# Patient Record
Sex: Male | Born: 1935 | Race: White | Hispanic: No | Marital: Married
Health system: Southern US, Community
[De-identification: ages and names within clinical notes are randomized; demographics above are authoritative.]

---

## 2005-04-05 ENCOUNTER — Ambulatory Visit: Payer: Self-pay | Admitting: Internal Medicine

## 2005-08-29 ENCOUNTER — Inpatient Hospital Stay: Payer: Self-pay | Admitting: General Practice

## 2006-03-28 ENCOUNTER — Inpatient Hospital Stay: Payer: Self-pay | Admitting: General Practice

## 2006-04-05 ENCOUNTER — Ambulatory Visit: Payer: Self-pay | Admitting: General Practice

## 2007-01-11 ENCOUNTER — Observation Stay: Payer: Self-pay | Admitting: Unknown Physician Specialty

## 2007-01-11 ENCOUNTER — Other Ambulatory Visit: Payer: Self-pay

## 2007-01-12 ENCOUNTER — Other Ambulatory Visit: Payer: Self-pay

## 2008-05-08 ENCOUNTER — Ambulatory Visit: Payer: Self-pay | Admitting: Ophthalmology

## 2008-05-20 ENCOUNTER — Ambulatory Visit: Payer: Self-pay | Admitting: Ophthalmology

## 2010-02-28 ENCOUNTER — Inpatient Hospital Stay: Payer: Self-pay | Admitting: Internal Medicine

## 2010-05-20 ENCOUNTER — Ambulatory Visit: Payer: Self-pay | Admitting: Internal Medicine

## 2010-10-01 ENCOUNTER — Emergency Department: Payer: Self-pay | Admitting: Emergency Medicine

## 2010-10-07 ENCOUNTER — Ambulatory Visit: Payer: Self-pay | Admitting: Urology

## 2010-10-09 ENCOUNTER — Inpatient Hospital Stay: Payer: Self-pay | Admitting: Internal Medicine

## 2010-10-15 ENCOUNTER — Emergency Department: Payer: Self-pay | Admitting: Emergency Medicine

## 2012-02-29 ENCOUNTER — Ambulatory Visit: Payer: Self-pay | Admitting: Internal Medicine

## 2012-05-31 LAB — CBC
HGB: 11.8 g/dL — ABNORMAL LOW (ref 13.0–18.0)
MCH: 30.6 pg (ref 26.0–34.0)
MCV: 91 fL (ref 80–100)
Platelet: 147 10*3/uL — ABNORMAL LOW (ref 150–440)
RDW: 14.9 % — ABNORMAL HIGH (ref 11.5–14.5)
WBC: 5.6 10*3/uL (ref 3.8–10.6)

## 2012-05-31 LAB — DRUG SCREEN, URINE
Barbiturates, Ur Screen: NEGATIVE (ref ?–200)
Benzodiazepine, Ur Scrn: NEGATIVE (ref ?–200)
Cannabinoid 50 Ng, Ur ~~LOC~~: NEGATIVE (ref ?–50)
MDMA (Ecstasy)Ur Screen: NEGATIVE (ref ?–500)
Methadone, Ur Screen: NEGATIVE (ref ?–300)
Opiate, Ur Screen: NEGATIVE (ref ?–300)
Phencyclidine (PCP) Ur S: NEGATIVE (ref ?–25)
Tricyclic, Ur Screen: NEGATIVE (ref ?–1000)

## 2012-05-31 LAB — COMPREHENSIVE METABOLIC PANEL
Alkaline Phosphatase: 175 U/L — ABNORMAL HIGH (ref 50–136)
Anion Gap: 6 — ABNORMAL LOW (ref 7–16)
Bilirubin,Total: 0.5 mg/dL (ref 0.2–1.0)
Calcium, Total: 8.8 mg/dL (ref 8.5–10.1)
Creatinine: 1.36 mg/dL — ABNORMAL HIGH (ref 0.60–1.30)
EGFR (African American): 53 — ABNORMAL LOW
Potassium: 4.7 mmol/L (ref 3.5–5.1)
SGOT(AST): 32 U/L (ref 15–37)
Sodium: 139 mmol/L (ref 136–145)
Total Protein: 7.2 g/dL (ref 6.4–8.2)

## 2012-05-31 LAB — URINALYSIS, COMPLETE
Ketone: NEGATIVE
RBC,UR: 16 /HPF (ref 0–5)
Specific Gravity: 1.017 (ref 1.003–1.030)
WBC UR: 12 /HPF (ref 0–5)

## 2012-05-31 LAB — PROTIME-INR: Prothrombin Time: 14.1 secs (ref 11.5–14.7)

## 2012-05-31 LAB — APTT: Activated PTT: 33.9 secs (ref 23.6–35.9)

## 2012-05-31 LAB — ETHANOL
Ethanol %: <0.003 % (ref 0.000–0.080)
Ethanol: <3 mg/dL

## 2012-06-01 ENCOUNTER — Inpatient Hospital Stay: Payer: Self-pay | Admitting: Internal Medicine

## 2012-06-01 LAB — CK TOTAL AND CKMB (NOT AT ARMC)
CK, Total: 66 U/L (ref 35–232)
CK-MB: 1.4 ng/mL (ref 0.5–3.6)
CK-MB: 1.7 ng/mL (ref 0.5–3.6)

## 2012-06-01 LAB — TROPONIN I: Troponin-I: 0.18 ng/mL — ABNORMAL HIGH

## 2012-06-02 LAB — COMPREHENSIVE METABOLIC PANEL
Albumin: 3.5 g/dL (ref 3.4–5.0)
Alkaline Phosphatase: 156 U/L — ABNORMAL HIGH (ref 50–136)
Anion Gap: 8 (ref 7–16)
Calcium, Total: 8.8 mg/dL (ref 8.5–10.1)
Chloride: 106 mmol/L (ref 98–107)
Creatinine: 1.12 mg/dL (ref 0.60–1.30)
Osmolality: 282 (ref 275–301)
SGOT(AST): 29 U/L (ref 15–37)
SGPT (ALT): 27 U/L (ref 12–78)

## 2012-06-02 LAB — CBC WITH DIFFERENTIAL/PLATELET
Basophil #: 0 10*3/uL (ref 0.0–0.1)
Eosinophil #: 0.1 10*3/uL (ref 0.0–0.7)
HGB: 11.6 g/dL — ABNORMAL LOW (ref 13.0–18.0)
MCH: 29.7 pg (ref 26.0–34.0)
MCV: 92 fL (ref 80–100)
Monocyte #: 0.6 x10 3/mm (ref 0.2–1.0)
Neutrophil %: 67.6 %
RBC: 3.92 10*6/uL — ABNORMAL LOW (ref 4.40–5.90)
RDW: 14.4 % (ref 11.5–14.5)

## 2012-06-04 LAB — BASIC METABOLIC PANEL
Anion Gap: 6 — ABNORMAL LOW (ref 7–16)
Calcium, Total: 8.8 mg/dL (ref 8.5–10.1)
Chloride: 105 mmol/L (ref 98–107)
Co2: 26 mmol/L (ref 21–32)
Creatinine: 1.1 mg/dL (ref 0.60–1.30)
Glucose: 97 mg/dL (ref 65–99)
Potassium: 4.1 mmol/L (ref 3.5–5.1)
Sodium: 137 mmol/L (ref 136–145)

## 2012-11-20 DEATH — deceased

## 2013-09-21 IMAGING — CT CT HEAD WITHOUT CONTRAST
1 series · 15 of 30 positions shown, 19 images · non-contrast
Comparison: none

REASON FOR EXAM: ams
COMMENTS:

[Series 2: soft tissue · axial · 0.43mm/px · z∈[+566,+701]mm · 15 of 31 slices shown, 19 images]
[im 2/31  brain]
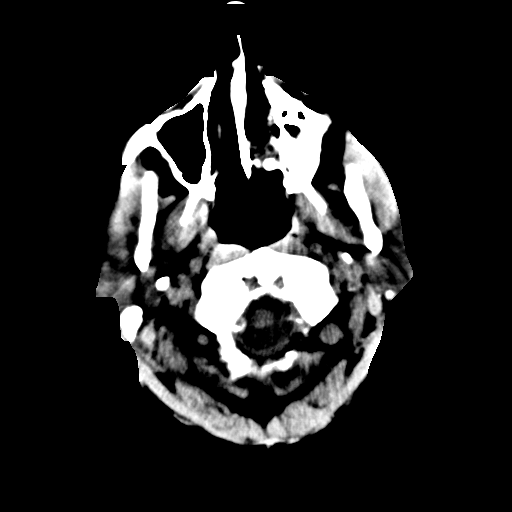
[im 2/31  bone]
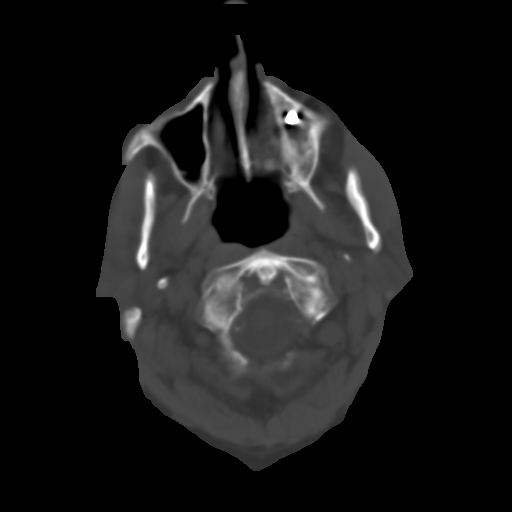
[im 4/31  brain]
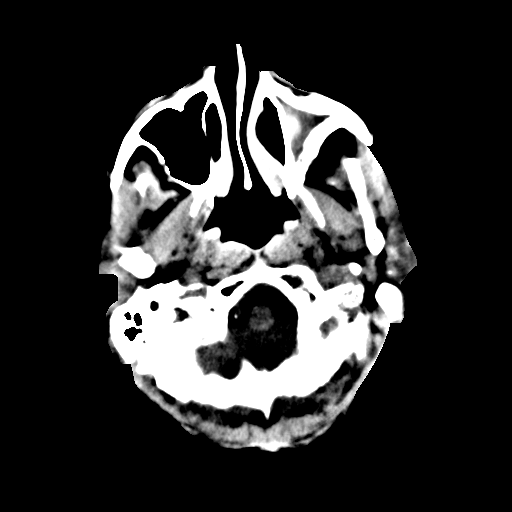
[im 6/31  brain]
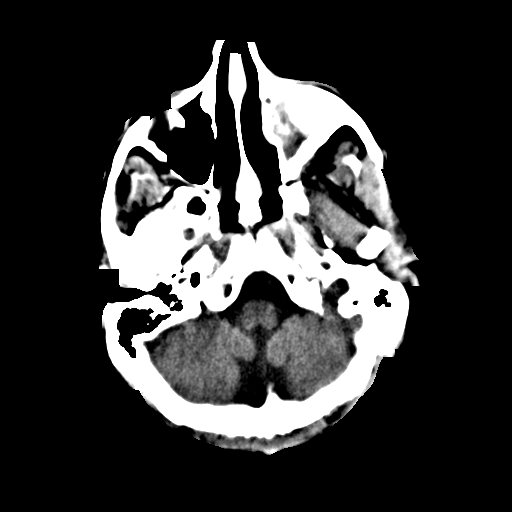
[im 8/31  brain]
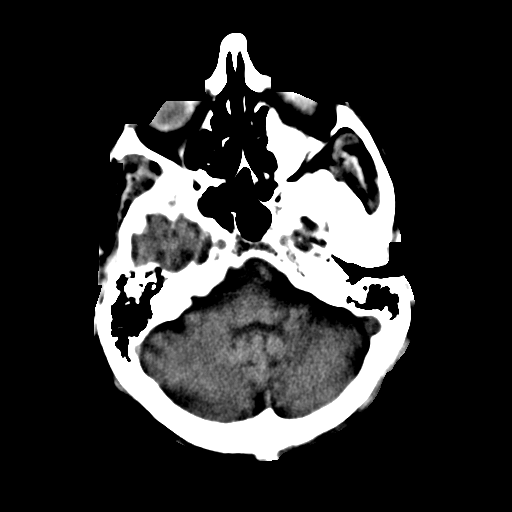
[im 10/31  brain]
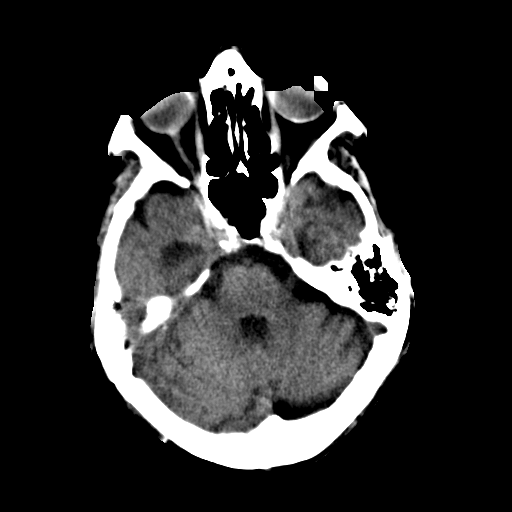
[im 10/31  bone]
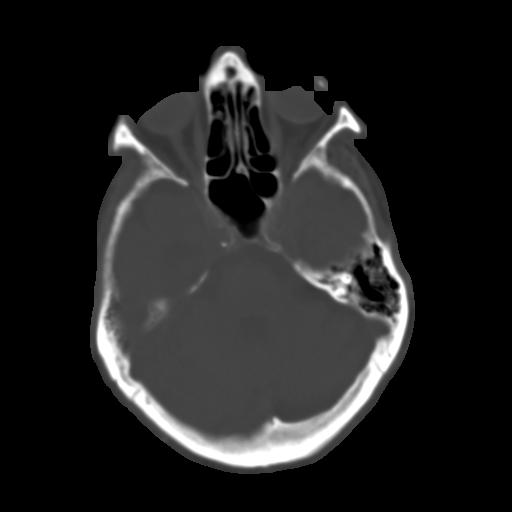
[im 12/31  brain]
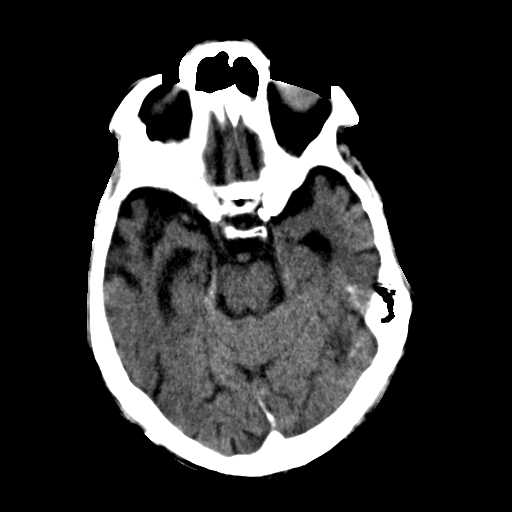
[im 14/31  brain]
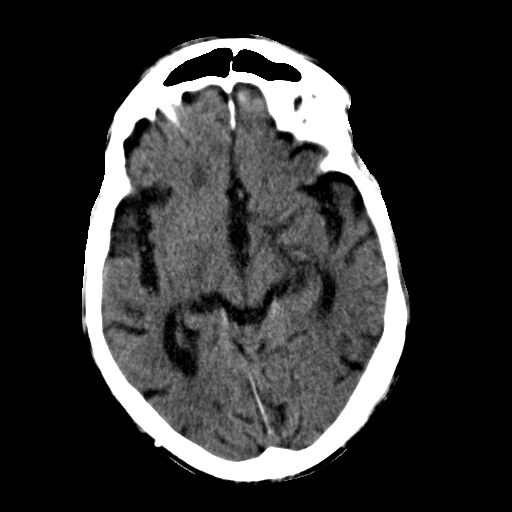
[im 16/31  brain]
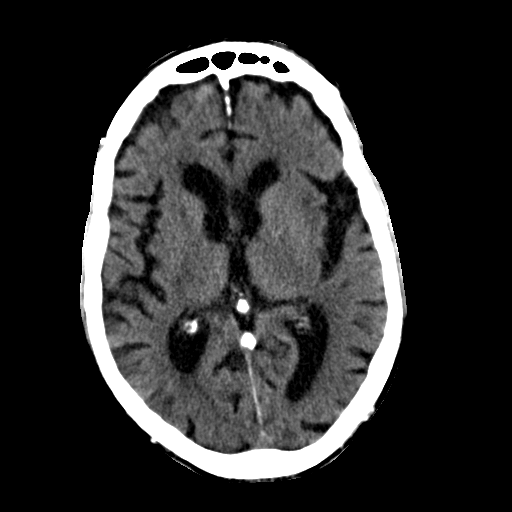
[im 17/31  brain]
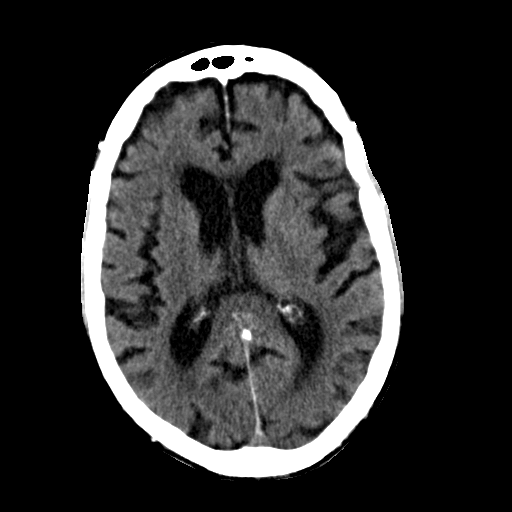
[im 17/31  bone]
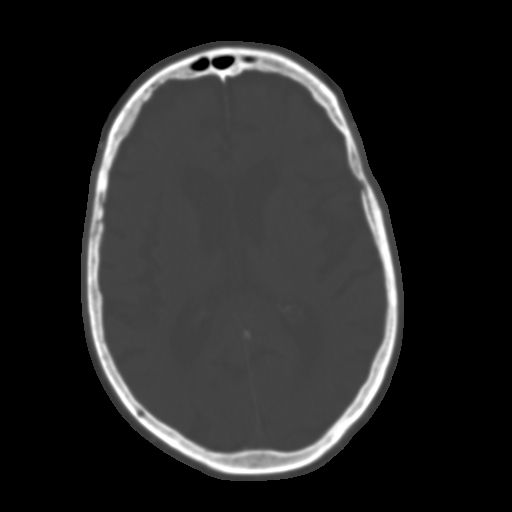
[im 19/31  brain]
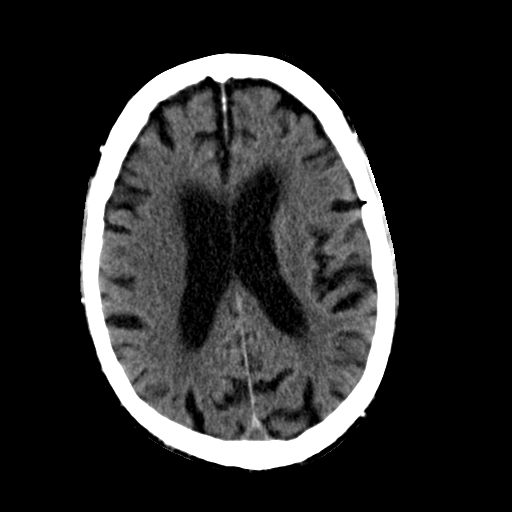
[im 21/31  brain]
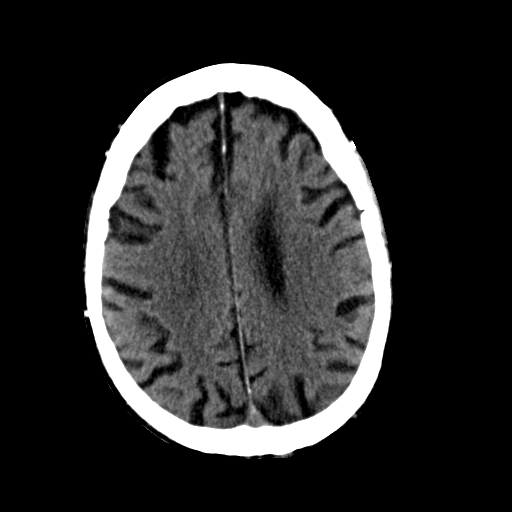
[im 23/31  brain]
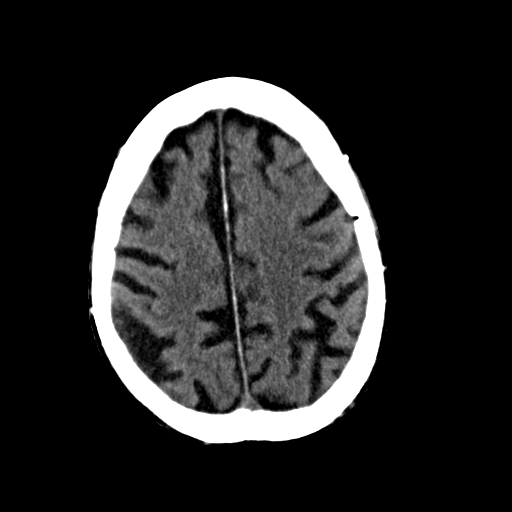
[im 25/31  brain]
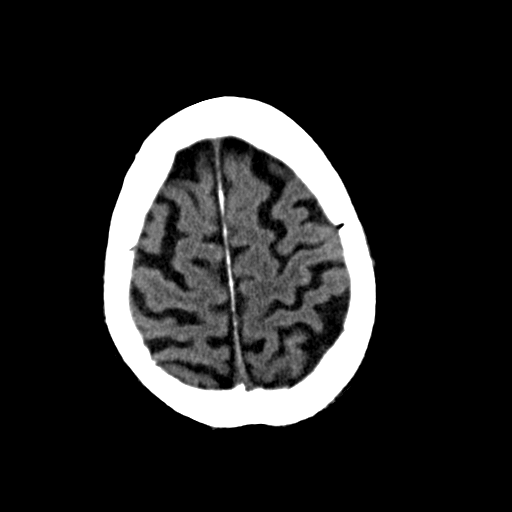
[im 25/31  bone]
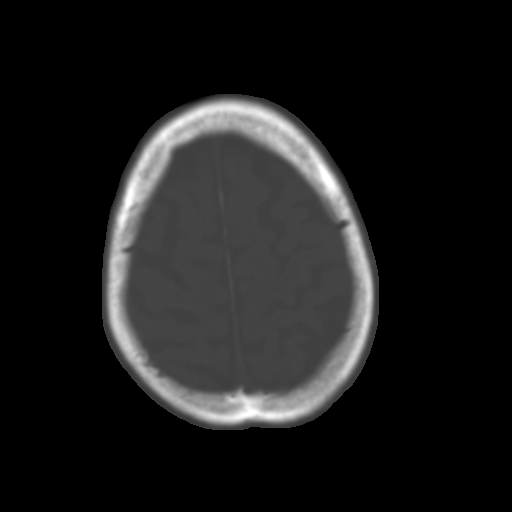
[im 27/31  brain]
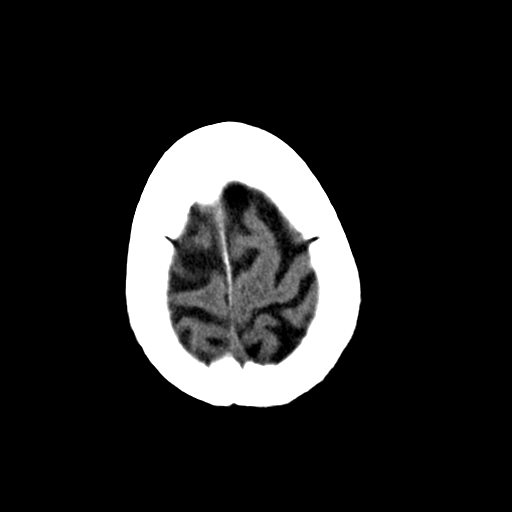
[im 29/31  brain]
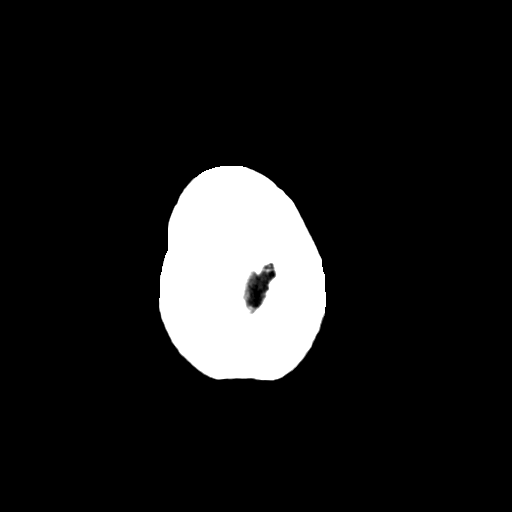

[15 of 30 positions shown; findings below may reference images not displayed]

PROCEDURE:     CT  - CT HEAD WITHOUT CONTRAST  - May 31, 2012 [DATE]

RESULT:     Axial noncontrast CT scanning was performed through the brain
with reconstructions at 5 mm intervals and slice thicknesses. Comparison is
made to the study dated October 08, 2010.

There is moderate diffuse cerebral and cerebellar atrophy with compensatory
ventriculomegaly. There is no intracranial hemorrhage nor intracranial mass
effect. Old subcentimeter lacunar infarctions in the superior aspect of the
basal ganglia on the left are present. The cerebellum and brainstem are
normal in density. At bone window settings there is chronic opacification of
the left maxillary sinus. A focal metallic density is present within it.
There is no evidence of an acute skull fracture.
IMPRESSION: 1. There is no evidence of acute ischemic or hemorrhagic infarction. There
are findings consistent with chronic small vessel ischemic change which are
stable.
2. There is no intracranial mass effect.
3. There is mild to moderate diffuse cerebral atrophy with compensatory
ventriculomegaly.

A preliminary report was sent to the [HOSPITAL] the conclusion
of the study.

[REDACTED]

## 2013-09-22 IMAGING — CR DG CHEST 2V
1 series · 2 of 2 positions shown · non-contrast
Comparison: none

REASON FOR EXAM: ams
COMMENTS:

PROCEDURE:     DXR - DXR CHEST PA (OR AP) AND LATERAL  - June 01, 2012 [DATE]
RESULT:     The cardiac silhouette is mildly enlarged. There is a left-sided
pacemaker device present. Sternotomy wires are present. The bones are
osteopenic. The lungs appear to be clear.

[Series 1: w chest lat · 0.14mm/px · 2 of 2 slices shown]
[im 1/2]
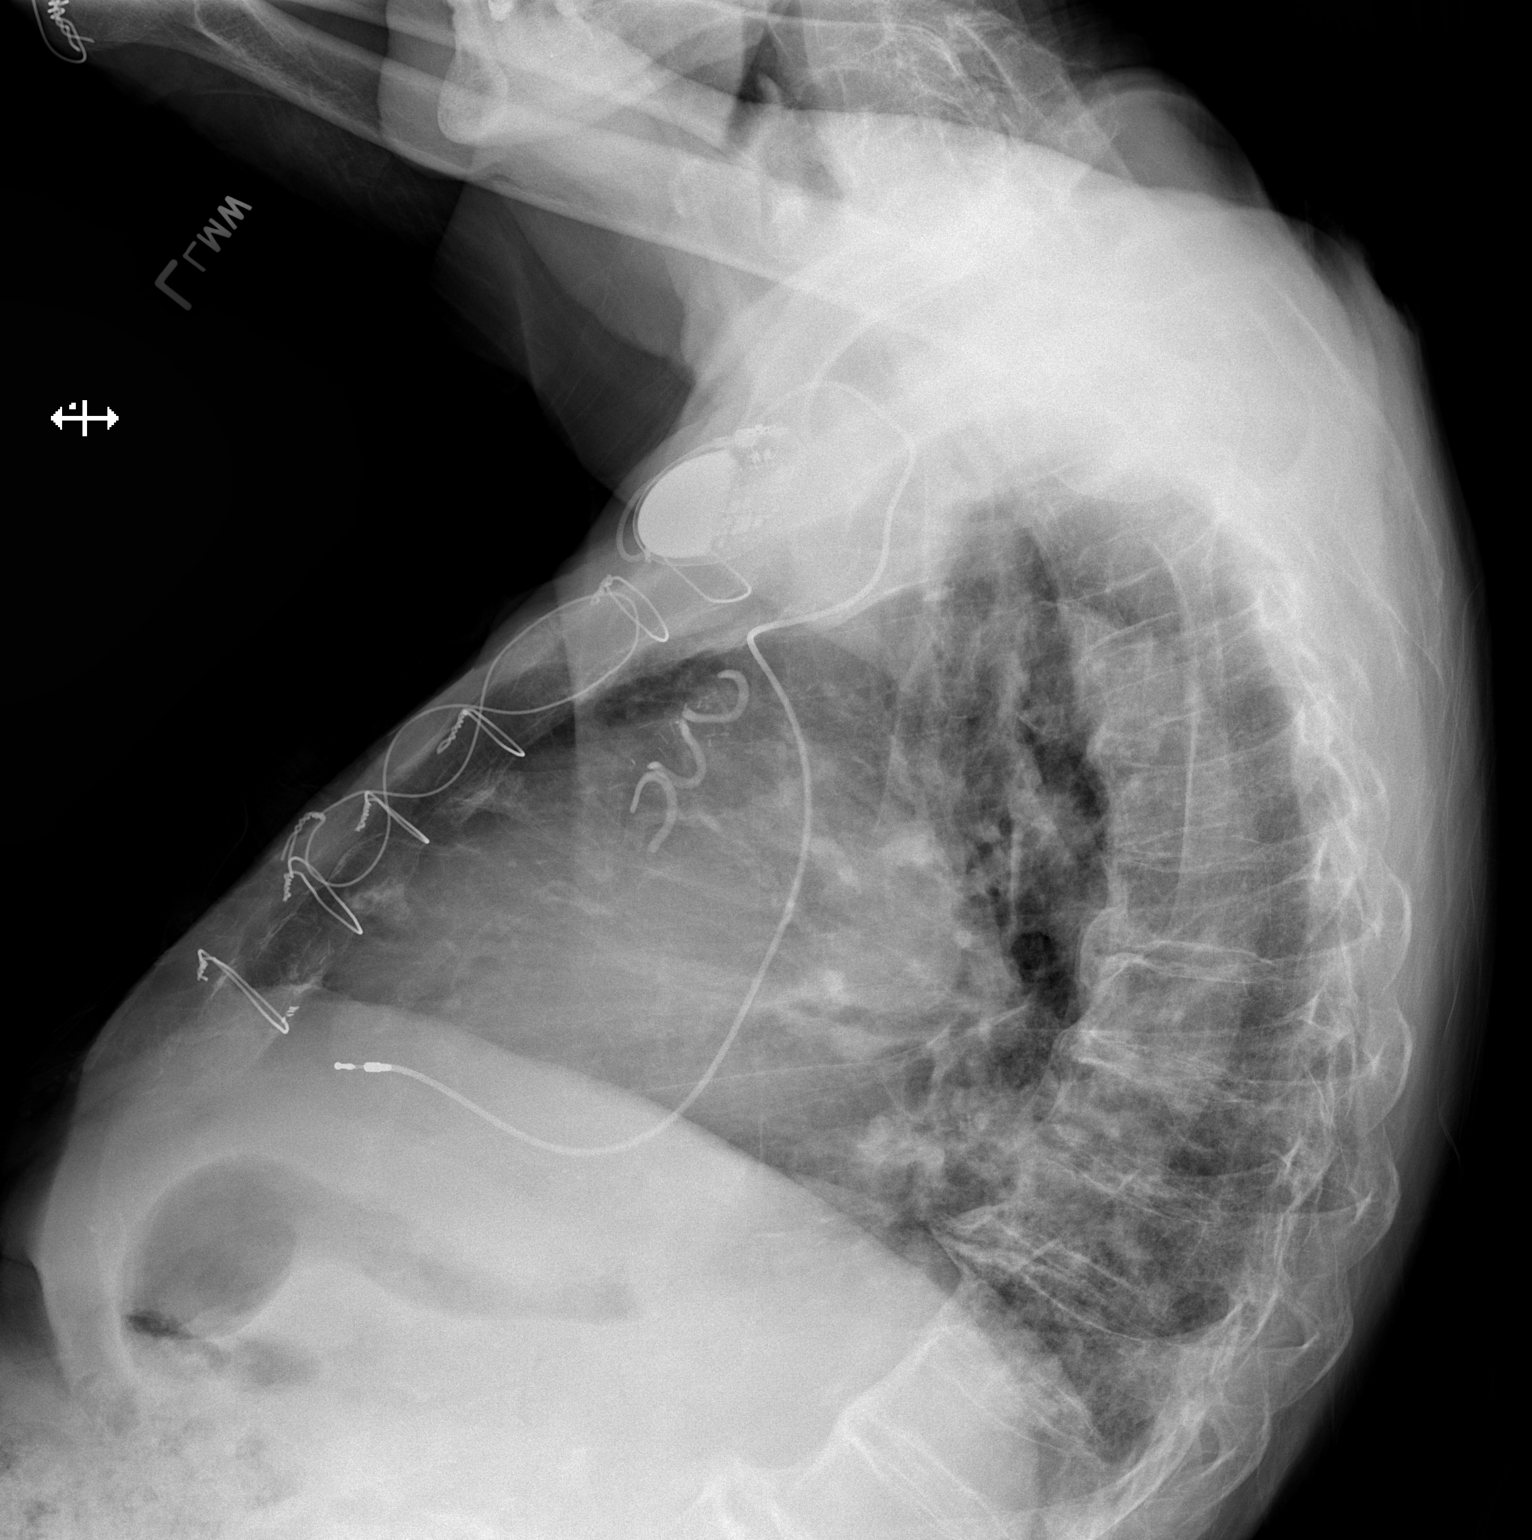
[im 2/2]
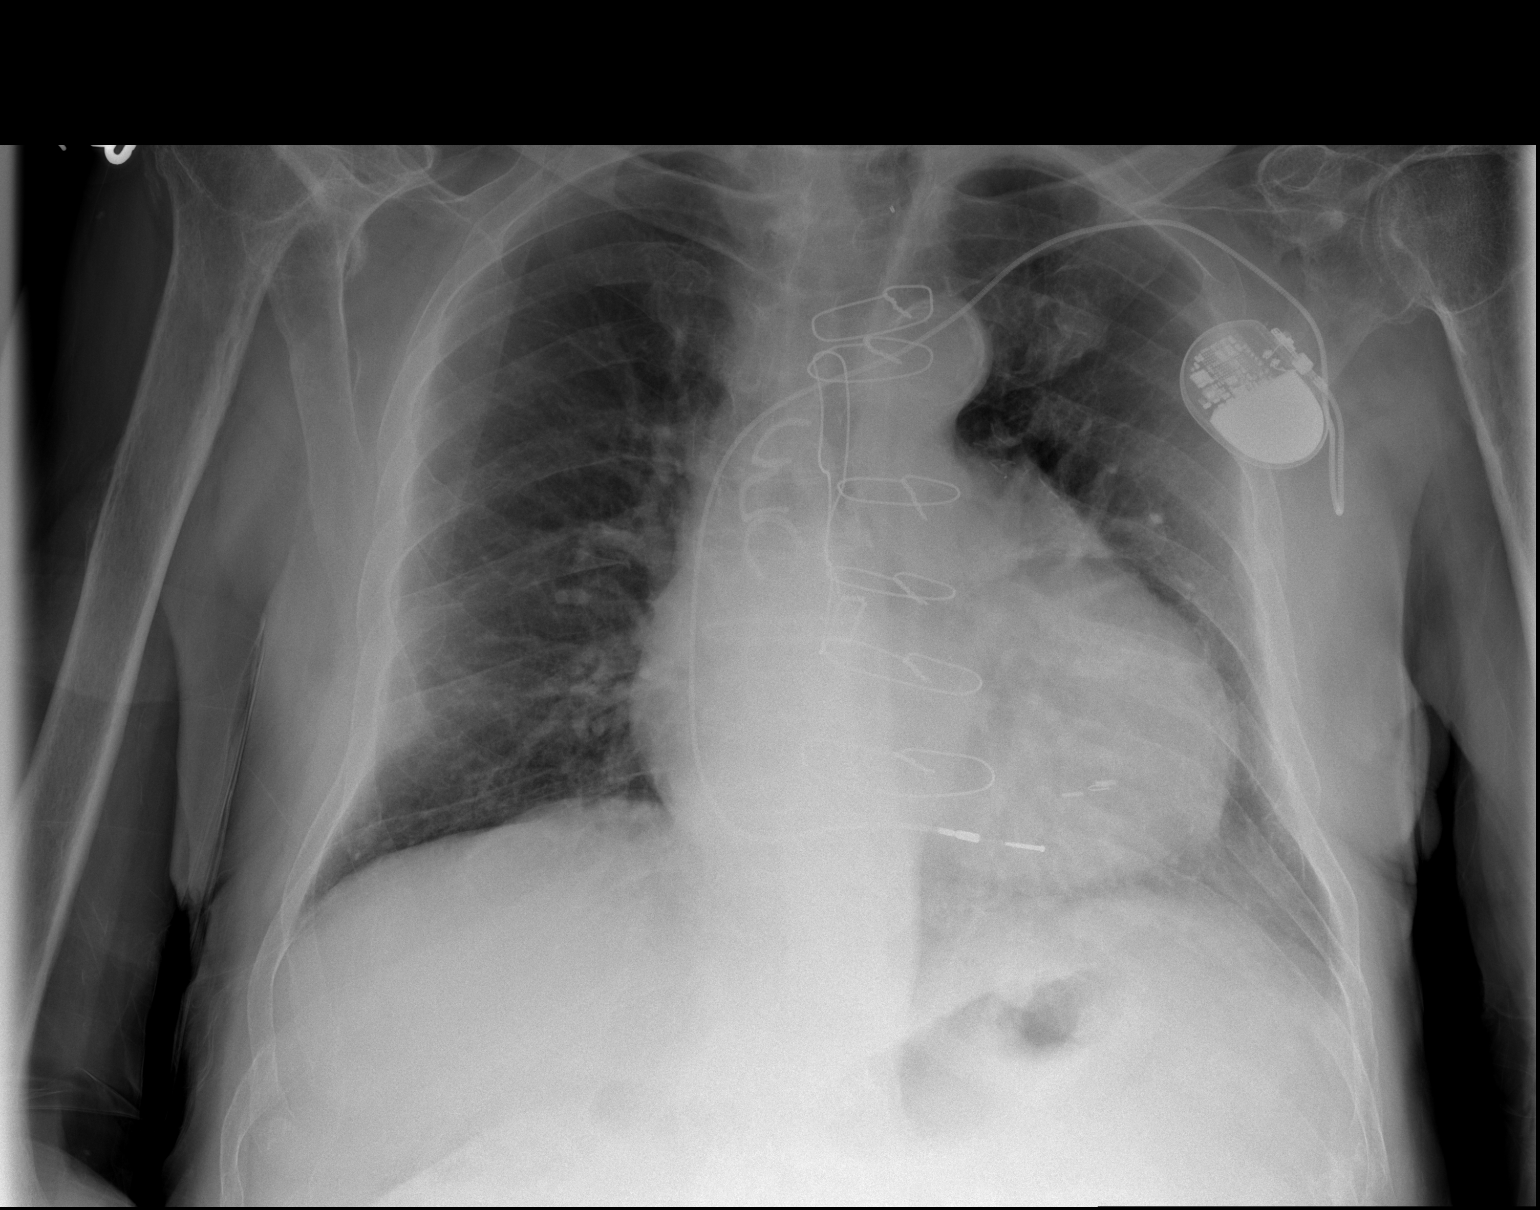

[2 of 2 positions shown; findings below may reference images not displayed]

IMPRESSION: COPD. Cardiomegaly. Osteoporosis. If there is concern for
rib fractures dedicated rib films would be recommended.

[REDACTED]

## 2014-09-12 NOTE — H&P (Signed)
PATIENT NAME:  Jared Dennis, Jared Dennis MR#:  [id number] DATE OF BIRTH:  November 19, 1920  DATE OF ADMISSION:  06/01/2012  REFERRING PHYSICIAN:  Dr. Ulice Brilliant.   PRIMARY CARE PHYSICIAN AND CARDIOLOGIST:  Dr. Cletis Athens.   CHIEF COMPLAINT:  Confusion and altered mental status.   HISTORY OF PRESENT ILLNESS:  This is an 81 year old male with significant past medical history of dementia, coronary artery disease status post bypass grafting, status post bladder cancer with bladder resection with urostomy bag, history of atrial fibrillation with pauses, status post permanent pacemaker, not on anticoagulation due to high risk of falls, presents with altered mental status.  The patient has advanced dementia, both sons at bedside and they give the history.  They report their father has advanced dementia, lives at home with their mother who has been mainly taking care of him at home, they report over the last week they have noticed acute changes in his mental status where he has started to become more confused, agitated, mildly combative, which prompted them to bring him to the Emergency Department.  In the ED, patient was afebrile, had no leukocytosis, had mild elevation of his creatinine, but patient was found to have positive urinalysis, as well as mild redness and erythema in the left lower extremity. As well, patient was found to have elevated troponin at 0.18. He had T-wave changes in the lateral leads which were old when compared to the last EKG.  As well, patient denies any shortness of breath and denies chest pain. The patient was given 324 of aspirin and 60 mg of Lovenox in the ED for his elevated troponins.  As well, was started on Rocephin for UTI and mild left lower extremity cellulitis. Hospitalist service was requested to admit the patient for further management and work-up for his symptoms and complaints. Sons at bedside and they request case management consult for possible need for placement at SNF upon  discharge, as they think at this point it is a lot for their mother to handle at home where she cannot take care of him.  As well, the patient was found for a blood pressure to be in the lower side in the ED with systolic in the 80H, diastolic in the high 21Y, where it improved with a fluid bolus.  PAST MEDICAL HISTORY: 1.  Coronary artery disease, status post bypass grafting.  2.  Bladder cancer with bladder resection and urostomy bag.  3.  History of atrial fibrillation with pauses, status post permanent pacemaker, not on warfarin due to risk for fall.  4.  Dementia.  5.  Hyperlipidemia. 6.  Hypertension.   HOME MEDICATIONS:  Simvastatin 40 mg oral at bedtime, lisinopril 20 mg oral daily.   DRUG ALLERGIES:  No known drug allergies.  SOCIAL HISTORY:  The patient is married, has two sons, retired from [employer].  No drinking or illicit drug use.  Used to smoke pipe in the past, currently nothing.   FAMILY HISTORY:  Mother died of unknown cancer at her 33s.  Father died of age 48, as well, for unknown reason.   REVIEW OF SYSTEMS:  The patient is a poor historian secondary to his dementia, but can give basic answer to some of review of system questions.   CONSTITUTIONAL:  Denies any fever or chills.  Basically reports him feeling well.  "There is nothing wrong with me."  EYES:  Denies blurry vision, double vision, pain, inflammation.  EARS, NOSE, THROAT:  Denies tinnitus, hearing loss, epistaxis, discharge or  snoring.  RESPIRATORY:  Denies cough, wheezing, hemoptysis, dyspnea.  CARDIOVASCULAR:  Denies chest pain, edema, arrhythmia, palpitation.  GASTROINTESTINAL:  Denies nausea, vomiting, diarrhea, abdominal pain, hematemesis, melena.  GENITOURINARY:  Denies dysuria, hematuria, renal colic.   ENDOCRINE:  Denies polyuria, polydipsia, heat or cold intolerance.  HEMATOLOGY:  Denies easy bruising, anemia, bleeding diathesis.  INTEGUMENTARY:  Denies any acne, rash or skin lesions.   MUSCULOSKELETAL:  Denies any gout, arthritis, cramps, knee pain, shoulder pain.  NEUROLOGIC:  Denies any weakness, epilepsy, tremors, vertigo, ataxia, as well denies dementia, but he is known to have history of dementia.  PSYCHIATRIC:  Denies anxiety, insomnia, schizophrenia, nervousness, substance or alcohol abuse.   PHYSICAL EXAMINATION: VITAL SIGNS:  Temperature 98.3, pulse 71, respiratory rate 18, blood pressure 105/67, saturating 99% on room air.  GENERAL:  Elderly male, is comfortable, in no apparent distress.  HEENT:  Head atraumatic, normocephalic.  Pupils equal, reactive to light.  Pink conjunctivae.  Anicteric sclerae.  Moist oral mucosa.  NECK:  Supple.  No thyromegaly.  No JVD.  CHEST:  Good air entry bilaterally.  No wheezing, rales, rhonchi.  CARDIOVASCULAR:  S1, S2 heard.  No rubs, murmur, gallops.  Irregularly irregular rhythm.  ABDOMEN:  Soft, nontender, nondistended, but all bowel sounds present.  Has urostomy bag in the right lower quadrant.  EXTREMITIES:  Has left lower extremity chronic discoloration.  Sons report this is kind of chronic, status post fall with multiple left lower extremity surgery.  There is some erythema at the left lower extremity.  Good pulses.  No clubbing.  No cyanosis.  PSYCHIATRIC:  The patient is pleasant, awake, alert x 1 to 2, communicative, confused.   NEUROLOGICAL:  Cranial nerves grossly intact.  Motor 5/5 in all extremities.  SKIN:  Normal skin turgor.  Warm and dry.  Some warmth in the left lower extremity.   PERTINENT LABORATORY DATA:  Glucose 98, BUN 29, creatinine 1.36, sodium 139, potassium 4.7, chloride 107, CO2 26, ALT 30, AST 32, alk phos 175.  Troponin 0.18.  White blood cell 5.6, hemoglobin 11.8, hematocrit 35.3, platelets 147.  EKG showing atrial fibrillation with T-wave inversion in the lateral leads, which is old when compared to last EKG.  INR 1.1.  Urinalysis showing positive nitrites with trace leukocyte esterase with white blood  cells of 12 and +1 bacteria.   ASSESSMENT AND PLAN:  This is an 81-year-old male with advanced dementia who presents with worsening altered mental status and confusion, has urinary tract infection and mild left lower extremity cellulitis, as well has elevated troponin.  1.  Altered mental status: The patient has baseline dementia which worsened recently, most likely due to urinary tract infection versus cellulitis, so patient will be started on Rocephin.  2.  Positive troponins: The patient denies any chest pain or shortness of breath. Has no EKG changes. Given 324 mg of aspirin in the ED and Lovenox. We will consult patient's cardiologist, Dr. Masoud in a.m. Agreed to continue him on Lovenox. We will continue to cycle patient's cardiac enzymes and follow the trend. Will obtain a 2-D echocardiogram.  We will hold on beta-blocker and lisinopril secondary to his borderline low blood pressure.  3.  Hypertension: Actually blood pressure is on the lower side, so we will hold lisinopril and we will continue with fluids.  4.  Atrial fibrillation: Currently rate-controlled.  As well, patient is high risk for falls, so he is not on warfarin. The patient is status post permanent pacemaker for pauses.    5.  Hyperlipidemia:  Continue with statin.  6.  Deep vein thrombosis prophylaxis:  Give him treatment dose Lovenox depending on decision by cardiology.  We will decide on further chemical anticoagulation.  7.  Gastrointestinal prophylaxis: On PPI.  8.  Ethics:  Discussed code status with both sons who at this point wishes him to remain FULL CODE. They report he has a LIVING WILL at home; they will bring it in a.m. to review it.   9.  As well, they request possible placement at SNF as they are saying at this point, it is too much for their mother to take care of at home, so we will order a case management consult.   TOTAL TIME SPENT ON ADMISSION AND PATIENT CARE:  Fifty-five minutes.      ____________________________ Albertine Patricia, MD dse:ea D: 06/01/2012 01:34:00 ET T: 06/01/2012 02:33:59 ET JOB#: [id number]  cc: Albertine Patricia, MD, <Dictator> Traci Gafford Graciela Husbands MD ELECTRONICALLY SIGNED 06/02/2012 6:04

## 2014-09-12 NOTE — Discharge Summary (Signed)
PATIENT NAME:  Jared StampsHOMPSON, Misha D MR#:  [id number] DATE OF BIRTH:  [birth year]  DATE OF ADMISSION:  06/01/2012 DATE OF DISCHARGE:  06/05/2012  PRIMARY CARE PHYSICIAN: Corky DownsJaved Masoud, MD  FINAL DIAGNOSES: 1.  Encephalopathy and urinary tract infection.  2.  History of dementia.  3.  Elevated troponin.  4.  Hypotension.  5.  Atrial fibrillation.  6.  Hyperlipidemia.  7.  Acute renal failure, which improved.   MEDICATIONS ON DISCHARGE: Vitamin D 1 tablet daily, not sure of the strength, lovastatin 80 mg half tablet at bedtime, aspirin 81 mg daily, Seroquel 25 mg 1/2 tablet at bedtime, metoprolol ER 25 mg daily, Cephalexin 500 mg every eight hours for six more days.   NOTE: Stop taking lisinopril and nitroglycerin.   DIET: Regular consistency, regular diet.   ACTIVITY: Activity as tolerated.   FOLLOW-UP: Follow-up in 1 to 2 weeks with Dr. Juel BurrowMasoud.   HOSPITAL COURSE: The patient was admitted 06/01/2012 and discharged 06/05/2012. The patient came in with confusion and altered mental status.   HISTORY OF PRESENT ILLNESS: This is an 81 year old man with history of dementia, heart disease, bladder cancer with bladder resection, urostomy bag, history of atrial fibrillation, status post pacemaker. Presented with altered mental status. He was found to have an elevated troponin and a urinary tract infection and started on Rocephin and his blood pressure was on the lower side. His nitroglycerin patch and lisinopril were held and he was given IV fluid resuscitation.  LABORATORY, DIAGNOSTIC AND RADIOLOGICAL DATA: EKG that showed atrial fibrillation, low voltage, troponin borderline at 0.08, PT/INR and PTT normal range. TSH 2.15, ethanol level negative. Glucose 98, BUN 29, creatinine 1.36, sodium 139, potassium 4.7, chloride 107, CO2 26, calcium 8.8, alkaline phosphatase 175, ALT 30, AST 32. White blood cell count 5.6, hemoglobin and hematocrit 11.5 and 35.3, platelet count 147. Urine culture grew out E.  coli, Citrobacter and klebsiella; all sensitive to the cephalosporins. Urine toxicology was negative. Urinalysis positive for nitrites and leukocyte esterase.   CT scan of the head: No acute evidence of infarction.  Chest x-ray showed cardiomegaly.   Next 2 troponins were borderline at 0.18 and 0.13 and creatinine improved to 1.1. White count upon discharge, 5.6.   HOSPITAL COURSE PER PROBLEM LIST:  1.  For the patient's encephalopathy and urinary tract infection, all the time when the patient does have a urostomy or chronic Foley, not sure if I need to treat this infection or not. There were 3 organisms that grew, all sensitive to cephalosporin; since he came in with hypotension, I figure I just complete the course. Rocephin was given during the hospital course switched over to Keflex. Full 10 day course was given.  2.  History of dementia. Family is having trouble taking care of him at home. He starts his days at 2:00 in the morning. He was prescribed Seroquel at night to sleep and this had helped a little bit. Family is going to have to pull together and help out. He makes too much money in order to  qualify for Medicaid and assisted living will be too much. Family declined home health. They will get help from the church and from family.  3.  Elevated troponin. This is not a myocardial infarction. The patient was given aspirin during the hospital course. Seen in consultation by Dr. Juel BurrowMasoud.  4.  Hypotension. This resolved with IV fluid hydration. The patient did have high blood pressure  upon discharge.  5.  Atrial fibrillation. Low dose  metoprolol was given for rate control. Heart rate did go as high as 112, but ranged between 77 and 112. Blood pressure upon discharge was 121/61. The patient was given low dose metoprolol.  6.  Hyperlipidemia, on lovastatin.  7.  Acute renal failure. This has improved. He does have some chronic kidney disease, baseline stage III. GFR 59 upon discharge.   TIME  SPENT ON DISCHARGE: Time on discharge 35 minutes.   OVERALL PROGNOSIS:  Poor. Family will have a hard time taking care of him if he does not sleep at night.      ____________________________ Herschell Dimes. Renae Gloss, MD rjw:cc D: 06/05/2012 17:16:42 ET T: 06/05/2012 23:40:57 ET JOB#: [id number]  cc: Herschell Dimes. Renae Gloss, MD, <Dictator> Corky Downs, MD Salley Scarlet MD ELECTRONICALLY SIGNED 06/07/2012 20:07

## 2014-09-12 NOTE — Consult Note (Signed)
PATIENT NAME:  Jared StampsHOMPSON, Sherlock D MR#:  [id number] DATE OF BIRTH:  April 08, 1921  DATE OF CONSULTATION:  06/01/2012  CONSULTING PHYSICIAN:  Corky DownsJaved Bret Vanessen, MD  HISTORY OF PRESENT ILLNESS:  The patient is an 81 year old gentleman who was admitted into the hospital with confusion and agitation.  The patient has a history of bladder resection and has a urostomy bag, chronic atrial fibrillation.  Also, has a permanent pacemaker.  The patient's dementia is getting worse.  He did not recognize his wife and got him agitated a couple of times.  The patient has a history of angina, coronary artery disease and has a history of bypass surgery, has a history of bladder cancer with resection in the remote past, atrial fibrillation status post permanent pacemaker, functioning well.    MEDICATIONS:  The patient is on simvastatin 40 mg p.o. daily.   ALLERGIES:  Not known.   SOCIAL HISTORY:  The patient is married, does not smoke, does not drink, used to smoke in the past.   FAMILY HISTORY:  Mother died of unknown cancer, age 79.   REVIEW OF SYSTEMS:  Right now the patient feels good, wants to go home.  There is no fever or chills, double vision, wheezing, chest pain, trouble passing water.  There is no epilepsy.  Family addresses the issue of confusion.  He does not seem to be depressed.   PHYSICAL EXAMINATION:  VITAL SIGNS:  Blood pressure was found to be 110/68.  HEENT:  Head normocephalic.  Pupils reactive.  Sclerae are nonicteric.  Tongue is moist, papillated.  NECK:  Supple.  Jugular venous pressure is not elevated.  Carotid upstroke is 2+ without any bruit.  There is no pedal edema.  ABDOMEN:  The patient has a cystostomy bag.    LABORATORY DATA:  BUN 29, creatinine 1.36, sodium 139.  Liver enzymes are normal.  Alkaline phosphatase 175, GFR 53, CPKs are normal, troponin 0.18, 0.18 and 0.13.  Urine drug screen was negative.  Hematocrit 35.3.  Urine culture revealed more than 100,000 colonies.   Electrocardiogram revealed atrial fibrillation.  CT scan did not show any evidence of acute stroke.  No intracranial mass, cerebral atrophy is noted.  Chest x-ray revealed COPD, cardiomegaly, osteoporosis.    IMPRESSION AND RECOMMENDATIONS:  The patient was seen in cardiology consult.  He is known to me for some time.  He has a borderline elevated troponin.  I do not think that he has a heart attack,elevated  troponin is due to supply and demand problem.  He is not having any chest pain, does not have any congestive heart failure.  He is having progressive dementia, so the case was discussed in detail with the family and because of that they want to put him in a nursing home facility and I am agreeable with that, so we will find a nursing home facility who can take him and this was discussed with the patient and he is agreeable with that.  The patient has altered mental status with baseline dementia which is worsening.  He has a chronic atrial fibrillation, but is not a candidate for anticoagulation because of anemia and gastrointestinal bleeding in the past.  He is hypertensive, but his blood pressure is under control.  For dyslipidemia, we will continue statin.      ____________________________ Corky DownsJaved Lydian Chavous, MD jm:ea D: 06/01/2012 18:17:00 ET T: 06/01/2012 23:59:21 ET JOB#: [id number]  cc: Corky DownsJaved Uzma Hellmer, MD, <Dictator> Corky DownsJAVED Kyndra Condron MD ELECTRONICALLY SIGNED 06/10/2012 11:39
